# Patient Record
Sex: Female | Born: 1945 | Race: Black or African American | Hispanic: No | Smoking: Never smoker
Health system: Southern US, Community
[De-identification: ages and names within clinical notes are randomized; demographics above are authoritative.]

## PROBLEM LIST (undated history)

## (undated) DIAGNOSIS — G4700 Insomnia, unspecified: Secondary | ICD-10-CM

## (undated) DIAGNOSIS — I10 Essential (primary) hypertension: Secondary | ICD-10-CM

## (undated) DIAGNOSIS — Z973 Presence of spectacles and contact lenses: Secondary | ICD-10-CM

## (undated) HISTORY — PX: COLONOSCOPY: SHX174

## (undated) HISTORY — PX: CYST REMOVAL HAND: SHX6279

## (undated) HISTORY — PX: ABDOMINAL HYSTERECTOMY: SHX81

## (undated) HISTORY — PX: TUBAL LIGATION: SHX77

## (undated) HISTORY — PX: APPENDECTOMY: SHX54

---

## 1999-08-02 ENCOUNTER — Other Ambulatory Visit: Admission: RE | Admit: 1999-08-02 | Discharge: 1999-08-02 | Payer: Self-pay | Admitting: Orthopedic Surgery

## 2000-11-14 ENCOUNTER — Ambulatory Visit (HOSPITAL_COMMUNITY): Admission: RE | Admit: 2000-11-14 | Discharge: 2000-11-14 | Payer: Self-pay | Admitting: Gastroenterology

## 2003-05-03 HISTORY — PX: ANKLE GANGLION CYST EXCISION: SHX1148

## 2014-03-26 ENCOUNTER — Other Ambulatory Visit: Payer: Self-pay | Admitting: Orthopedic Surgery

## 2014-05-09 ENCOUNTER — Encounter (HOSPITAL_BASED_OUTPATIENT_CLINIC_OR_DEPARTMENT_OTHER)
Admission: RE | Admit: 2014-05-09 | Discharge: 2014-05-09 | Disposition: A | Discharge status: Home / Self Care | Payer: Medicare Other | Source: Ambulatory Visit | Attending: Orthopedic Surgery | Admitting: Orthopedic Surgery

## 2014-05-09 ENCOUNTER — Encounter (HOSPITAL_BASED_OUTPATIENT_CLINIC_OR_DEPARTMENT_OTHER): Payer: Self-pay | Admitting: *Deleted

## 2014-05-09 ENCOUNTER — Other Ambulatory Visit: Payer: Self-pay

## 2014-05-09 DIAGNOSIS — Z01818 Encounter for other preprocedural examination: Secondary | ICD-10-CM | POA: Insufficient documentation

## 2014-05-09 DIAGNOSIS — R2231 Localized swelling, mass and lump, right upper limb: Secondary | ICD-10-CM | POA: Diagnosis present

## 2014-05-09 DIAGNOSIS — M67441 Ganglion, right hand: Secondary | ICD-10-CM | POA: Diagnosis not present

## 2014-05-09 DIAGNOSIS — I10 Essential (primary) hypertension: Secondary | ICD-10-CM | POA: Diagnosis not present

## 2014-05-09 DIAGNOSIS — G4700 Insomnia, unspecified: Secondary | ICD-10-CM | POA: Diagnosis not present

## 2014-05-09 LAB — BASIC METABOLIC PANEL
Anion gap: 12 (ref 5–15)
BUN: 8 mg/dL (ref 6–23)
CO2: 27 mmol/L (ref 19–32)
Calcium: 9.2 mg/dL (ref 8.4–10.5)
Chloride: 99 mEq/L (ref 96–112)
Creatinine, Ser: 0.88 mg/dL (ref 0.50–1.10)
GFR calc Af Amer: 78 mL/min — ABNORMAL LOW (ref >90)
GFR, EST NON AFRICAN AMERICAN: 67 mL/min — AB (ref >90)
Glucose, Bld: 80 mg/dL (ref 70–99)
Potassium: 3.8 mmol/L (ref 3.5–5.1)
Sodium: 138 mmol/L (ref 135–145)

## 2014-05-09 NOTE — Progress Notes (Signed)
Pt has labs 2 months ago-will need bmet-ekg

## 2014-05-15 ENCOUNTER — Ambulatory Visit (HOSPITAL_BASED_OUTPATIENT_CLINIC_OR_DEPARTMENT_OTHER): Payer: Medicare Other | Admitting: Certified Registered"

## 2014-05-15 ENCOUNTER — Encounter (HOSPITAL_BASED_OUTPATIENT_CLINIC_OR_DEPARTMENT_OTHER)
Admission: RE | Disposition: A | Discharge status: Home / Self Care | Payer: Self-pay | Source: Ambulatory Visit | Attending: Orthopedic Surgery

## 2014-05-15 ENCOUNTER — Encounter (HOSPITAL_BASED_OUTPATIENT_CLINIC_OR_DEPARTMENT_OTHER): Payer: Self-pay | Admitting: Certified Registered"

## 2014-05-15 ENCOUNTER — Ambulatory Visit (HOSPITAL_BASED_OUTPATIENT_CLINIC_OR_DEPARTMENT_OTHER)
Admission: RE | Admit: 2014-05-15 | Discharge: 2014-05-15 | Disposition: A | Discharge status: Home / Self Care | Payer: Medicare Other | Source: Ambulatory Visit | Attending: Orthopedic Surgery | Admitting: Orthopedic Surgery

## 2014-05-15 DIAGNOSIS — M67441 Ganglion, right hand: Secondary | ICD-10-CM | POA: Insufficient documentation

## 2014-05-15 DIAGNOSIS — G4700 Insomnia, unspecified: Secondary | ICD-10-CM | POA: Diagnosis not present

## 2014-05-15 DIAGNOSIS — I10 Essential (primary) hypertension: Secondary | ICD-10-CM | POA: Insufficient documentation

## 2014-05-15 HISTORY — PX: MASS EXCISION: SHX2000

## 2014-05-15 HISTORY — DX: Presence of spectacles and contact lenses: Z97.3

## 2014-05-15 HISTORY — DX: Essential (primary) hypertension: I10

## 2014-05-15 HISTORY — DX: Insomnia, unspecified: G47.00

## 2014-05-15 LAB — POCT HEMOGLOBIN-HEMACUE: Hemoglobin: 12.8 g/dL (ref 12.0–15.0)

## 2014-05-15 SURGERY — EXCISION MASS
Anesthesia: Monitor Anesthesia Care | Site: Finger | Laterality: Right

## 2014-05-15 MED ORDER — ONDANSETRON HCL 4 MG/2ML IJ SOLN
INTRAMUSCULAR | Status: DC | PRN
  Administered 2014-05-15: 4 mg via INTRAVENOUS

## 2014-05-15 MED ORDER — HYDROCODONE-ACETAMINOPHEN 5-325 MG PO TABS: ORAL_TABLET | ORAL | Status: AC

## 2014-05-15 MED ORDER — FENTANYL CITRATE 0.05 MG/ML IJ SOLN: 25.0000 ug | INTRAMUSCULAR | Status: DC | PRN

## 2014-05-15 MED ORDER — FENTANYL CITRATE 0.05 MG/ML IJ SOLN
INTRAMUSCULAR | Status: AC
  Filled 2014-05-15: qty 6

## 2014-05-15 MED ORDER — MIDAZOLAM HCL 2 MG/2ML IJ SOLN
INTRAMUSCULAR | Status: AC
  Filled 2014-05-15: qty 2

## 2014-05-15 MED ORDER — CHLORHEXIDINE GLUCONATE 4 % EX LIQD: 60.0000 mL | Freq: Once | CUTANEOUS | Status: DC

## 2014-05-15 MED ORDER — PROPOFOL 10 MG/ML IV EMUL
INTRAVENOUS | Status: AC
  Filled 2014-05-15: qty 50

## 2014-05-15 MED ORDER — MIDAZOLAM HCL 5 MG/5ML IJ SOLN
INTRAMUSCULAR | Status: DC | PRN
Start: 2014-05-15 — End: 2014-05-15
  Administered 2014-05-15: 2 mg via INTRAVENOUS

## 2014-05-15 MED ORDER — FENTANYL CITRATE 0.05 MG/ML IJ SOLN: 50.0000 ug | INTRAMUSCULAR | Status: DC | PRN

## 2014-05-15 MED ORDER — ONDANSETRON HCL 4 MG/2ML IJ SOLN: 4.0000 mg | Freq: Once | INTRAMUSCULAR | Status: DC | PRN

## 2014-05-15 MED ORDER — LACTATED RINGERS IV SOLN
INTRAVENOUS | Status: DC
  Administered 2014-05-15: 09:00:00 via INTRAVENOUS

## 2014-05-15 MED ORDER — 0.9 % SODIUM CHLORIDE (POUR BTL) OPTIME
TOPICAL | Status: DC | PRN
  Administered 2014-05-15: 100 mL

## 2014-05-15 MED ORDER — MIDAZOLAM HCL 2 MG/2ML IJ SOLN: 1.0000 mg | INTRAMUSCULAR | Status: DC | PRN

## 2014-05-15 MED ORDER — CEFAZOLIN SODIUM-DEXTROSE 2-3 GM-% IV SOLR
2.0000 g | INTRAVENOUS | Status: AC
  Administered 2014-05-15: 2 g via INTRAVENOUS

## 2014-05-15 MED ORDER — CEFAZOLIN SODIUM-DEXTROSE 2-3 GM-% IV SOLR
INTRAVENOUS | Status: AC
  Filled 2014-05-15: qty 50

## 2014-05-15 MED ORDER — LIDOCAINE HCL (PF) 0.5 % IJ SOLN
INTRAMUSCULAR | Status: DC | PRN
  Administered 2014-05-15: 25 mL via INTRAVENOUS

## 2014-05-15 MED ORDER — SUCCINYLCHOLINE CHLORIDE 20 MG/ML IJ SOLN
INTRAMUSCULAR | Status: AC
  Filled 2014-05-15: qty 1

## 2014-05-15 MED ORDER — BUPIVACAINE HCL (PF) 0.25 % IJ SOLN
INTRAMUSCULAR | Status: DC | PRN
  Administered 2014-05-15: 8 mL

## 2014-05-15 MED ORDER — PROPOFOL 10 MG/ML IV BOLUS
INTRAVENOUS | Status: DC | PRN
  Administered 2014-05-15: 20 mg via INTRAVENOUS
  Administered 2014-05-15: 10 mg via INTRAVENOUS

## 2014-05-15 MED ORDER — FENTANYL CITRATE 0.05 MG/ML IJ SOLN
INTRAMUSCULAR | Status: DC | PRN
  Administered 2014-05-15 (×2): 50 ug via INTRAVENOUS

## 2014-05-15 SURGICAL SUPPLY — 50 items
BANDAGE COBAN STERILE 2 (GAUZE/BANDAGES/DRESSINGS) IMPLANT
BANDAGE ELASTIC 3 VELCRO ST LF (GAUZE/BANDAGES/DRESSINGS) IMPLANT
BENZOIN TINCTURE PRP APPL 2/3 (GAUZE/BANDAGES/DRESSINGS) IMPLANT
BLADE MINI RND TIP GREEN BEAV (BLADE) IMPLANT
BLADE SURG 15 STRL LF DISP TIS (BLADE) ×2 IMPLANT
BLADE SURG 15 STRL SS (BLADE) ×4
BNDG COHESIVE 1X5 TAN STRL LF (GAUZE/BANDAGES/DRESSINGS) IMPLANT
BNDG CONFORM 2 STRL LF (GAUZE/BANDAGES/DRESSINGS) IMPLANT
BNDG ELASTIC 2 VLCR STRL LF (GAUZE/BANDAGES/DRESSINGS) IMPLANT
BNDG ESMARK 4X9 LF (GAUZE/BANDAGES/DRESSINGS) ×3 IMPLANT
BNDG GAUZE 1X2.1 STRL (MISCELLANEOUS) ×3 IMPLANT
BNDG GAUZE ELAST 4 BULKY (GAUZE/BANDAGES/DRESSINGS) ×3 IMPLANT
BNDG PLASTER X FAST 3X3 WHT LF (CAST SUPPLIES) IMPLANT
CHLORAPREP W/TINT 26ML (MISCELLANEOUS) ×3 IMPLANT
CLOSURE WOUND 1/2 X4 (GAUZE/BANDAGES/DRESSINGS)
CORDS BIPOLAR (ELECTRODE) ×3 IMPLANT
COVER BACK TABLE 60X90IN (DRAPES) ×3 IMPLANT
COVER MAYO STAND STRL (DRAPES) ×3 IMPLANT
CUFF TOURNIQUET SINGLE 18IN (TOURNIQUET CUFF) ×3 IMPLANT
DRAPE EXTREMITY T 121X128X90 (DRAPE) ×3 IMPLANT
DRAPE SURG 17X23 STRL (DRAPES) ×3 IMPLANT
GAUZE SPONGE 4X4 12PLY STRL (GAUZE/BANDAGES/DRESSINGS) ×3 IMPLANT
GAUZE XEROFORM 1X8 LF (GAUZE/BANDAGES/DRESSINGS) ×3 IMPLANT
GLOVE BIO SURGEON STRL SZ7.5 (GLOVE) ×3 IMPLANT
GLOVE BIOGEL PI IND STRL 8 (GLOVE) ×1 IMPLANT
GLOVE BIOGEL PI INDICATOR 8 (GLOVE) ×2
GOWN STRL REUS W/ TWL LRG LVL3 (GOWN DISPOSABLE) ×1 IMPLANT
GOWN STRL REUS W/TWL LRG LVL3 (GOWN DISPOSABLE) ×2
GOWN STRL REUS W/TWL XL LVL3 (GOWN DISPOSABLE) ×3 IMPLANT
NEEDLE HYPO 25X1 1.5 SAFETY (NEEDLE) ×3 IMPLANT
NS IRRIG 1000ML POUR BTL (IV SOLUTION) ×3 IMPLANT
PACK BASIN DAY SURGERY FS (CUSTOM PROCEDURE TRAY) ×3 IMPLANT
PAD CAST 3X4 CTTN HI CHSV (CAST SUPPLIES) IMPLANT
PAD CAST 4YDX4 CTTN HI CHSV (CAST SUPPLIES) IMPLANT
PADDING CAST ABS 4INX4YD NS (CAST SUPPLIES) ×2
PADDING CAST ABS COTTON 4X4 ST (CAST SUPPLIES) ×1 IMPLANT
PADDING CAST COTTON 3X4 STRL (CAST SUPPLIES)
PADDING CAST COTTON 4X4 STRL (CAST SUPPLIES)
STOCKINETTE 4X48 STRL (DRAPES) ×3 IMPLANT
STRIP CLOSURE SKIN 1/2X4 (GAUZE/BANDAGES/DRESSINGS) IMPLANT
SUT ETHILON 3 0 PS 1 (SUTURE) IMPLANT
SUT ETHILON 4 0 PS 2 18 (SUTURE) IMPLANT
SUT ETHILON 5 0 P 3 18 (SUTURE) ×2
SUT MERSILENE 4 0 P 3 (SUTURE) ×3 IMPLANT
SUT NYLON ETHILON 5-0 P-3 1X18 (SUTURE) ×1 IMPLANT
SUT VIC AB 4-0 P2 18 (SUTURE) IMPLANT
SYR BULB 3OZ (MISCELLANEOUS) ×3 IMPLANT
SYR CONTROL 10ML LL (SYRINGE) ×3 IMPLANT
TOWEL OR 17X24 6PK STRL BLUE (TOWEL DISPOSABLE) ×3 IMPLANT
UNDERPAD 30X30 INCONTINENT (UNDERPADS AND DIAPERS) ×3 IMPLANT

## 2014-05-15 NOTE — H&P (Signed)
  Brenda Aguilar is an 69 y.o. female.   Chief Complaint: right small finger mass HPI: 69 yo rhd female with mass on right small finger x 2 years.  It is bothersome to her especially with palpation.  She wishes to have it removed.  Past Medical History  Diagnosis Date  . Hypertension   . Insomnia   . Wears contact lenses     Past Surgical History  Procedure Laterality Date  . Abdominal hysterectomy    . Appendectomy    . Tubal ligation    . Ankle ganglion cyst excision  2005    right  . Colonoscopy      History reviewed. No pertinent family history. Social History:  reports that she has never smoked. She does not have any smokeless tobacco history on file. She reports that she does not drink alcohol or use illicit drugs.  Allergies: No Known Allergies  Medications Prior to Admission  Medication Sig Dispense Refill  . acetaminophen (TYLENOL) 325 MG tablet Take 650 mg by mouth every 6 (six) hours as needed.    Marland Kitchen. amLODipine (NORVASC) 5 MG tablet Take 5 mg by mouth daily.    . Calcium-Phosphorus-Vitamin D (CITRACAL +D3 PO) Take by mouth every morning.    . cholecalciferol (VITAMIN D) 1000 UNITS tablet Take 1,000 Units by mouth daily. Takes 2000    . eszopiclone (LUNESTA) 1 MG TABS tablet Take 1 mg by mouth at bedtime as needed for sleep. Take immediately before bedtime      No results found for this or any previous visit (from the past 48 hour(s)).  No results found.   A comprehensive review of systems was negative except for: Eyes: positive for contacts/glasses  Height 5\' 3"  (1.6 m), weight 55.339 kg (122 lb).  General appearance: alert, cooperative and appears stated age Head: Normocephalic, without obvious abnormality, atraumatic Neck: supple, symmetrical, trachea midline Resp: clear to auscultation bilaterally Cardio: regular rate and rhythm GI: non tender Extremities: intact sensation and capillary refill all digits.  +epl/fpl/io.  no wounds.  right small finger  mass. Pulses: 2+ and symmetric Skin: Skin color, texture, turgor normal. No rashes or lesions Neurologic: Grossly normal Incision/Wound: none  Assessment/Plan Right small finger mass.  Non operative and operative treatment options were discussed with the patient and patient wishes to proceed with operative treatment. Risks, benefits, and alternatives of surgery were discussed and the patient agrees with the plan of care.   Elridge Stemm R 05/15/2014, 8:31 AM

## 2014-05-15 NOTE — Op Note (Signed)
Brenda Aguilar, Brenda Aguilar                  ACCOUNT NO.:  [id number]  MEDICAL RECORD NO.:  [id number]  LOCATION:                                 FACILITY:  PHYSICIAN:  Betha Loa, MD             DATE OF BIRTH:  DATE OF PROCEDURE:  05/15/2014 DATE OF DISCHARGE:                              OPERATIVE REPORT   PREOPERATIVE DIAGNOSIS:  Right small finger mass.  POSTOPERATIVE DIAGNOSIS:  Right small finger mass.  PROCEDURE:  Excision mass right small finger.  SURGEON:  Betha Loa, MD  ASSISTANT:  None.  ANESTHESIA:  Bier block with sedation.  IV FLUIDS:  Per anesthesia flow sheet.  ESTIMATED BLOOD LOSS:  Minimal.  COMPLICATIONS:  None.  SPECIMENS:  Right small finger mass to Pathology.  TOURNIQUET TIME:  23 minutes.  DISPOSITION:  Stable to PACU.  INDICATIONS:  Brenda Aguilar is a 68 year old female with a mass on her right small finger.  It is bothersome to her.  She states it is bothersome especially when she bumps it.  She wished to have it removed.  Risks, benefits, and alternatives of surgery were discussed including risk of blood loss, infection, damage to nerves, vessels, tendons, ligaments, bone, failure of surgery, need for additional surgery, complications with wound healing, continued pain, recurrence of mass.  She voiced understanding of these risks and elected to proceed.  OPERATIVE COURSE:  After being identified preoperatively by myself, the patient and I agreed upon procedure and site of procedure.  Surgical site was marked.  The risks, benefits, and alternatives of surgery were reviewed and she wished to proceed.  Surgical consent had been signed. She was given IV Ancef as preoperative antibiotic prophylaxis.  She was transferred to the operating room and placed on the operating room table in supine position with the right upper extremity on armboard.  A Bier block anesthesia was induced by anesthesiologist.  The right upper extremity was prepped and draped in  normal sterile orthopedic fashion. A surgical pause was performed between surgeons, anesthesia, and operating room staff, and all were in agreement as to the patient, procedure, and site of procedure.  Tourniquet at the proximal aspect of the forearm had been inflated for the Bier block.  Incision was made on the dorsum of the finger over the mass.  This was carried into subcutaneous tissues by spreading technique.  The mass was slightly adherent to the skin.  It was adherent to the tendon underneath.  It was freed up from all soft tissue attachments.  The tendon was intact. There was a small area of thinned tendon on the ulnar side proximal to the PIP joint.  The wound was copiously irrigated with sterile saline. A single stitch was placed in the thinned portion of the tendon with 4-0 Mersilene suture to reapproximate the thicker edges.  The skin was closed with 4-0 nylon in a horizontal mattress fashion.  The wound was then dressed with sterile Xeroform, 4 x 4 and wrapped with a Coban dressing lightly.  Tourniquet was deflated at 23 minutes.  Fingertips were pink with brisk capillary refill after deflation of the tourniquet. Operative  drapes were broken down, and the patient was awoken from anesthesia safely.  She was transferred back to stretcher and taken to PACU in stable condition.  I will see her back in the office in 1 week for postoperative followup.  I will give her Norco 5/325, 1-2 p.o. q.6 hours p.r.n. pain, dispensed #30.  A digital block was performed with 8 mL of 0.25% plain Marcaine to aid in postoperative analgesia.     Betha LoaKevin Nohelani Benning, MD     KK/MEDQ  D:  05/15/2014  T:  05/15/2014  Job:  [id number]

## 2014-05-15 NOTE — Anesthesia Postprocedure Evaluation (Signed)
  Anesthesia Post-op Note  Patient: Dirk DressMary L Mickley  Procedure(s) Performed: Procedure(s): EXCISION MASS RIGHT SMALL FINGER  (Right)  Patient Location: PACU  Anesthesia Type: Bier Block, MAC   Level of Consciousness: awake, alert  and oriented  Airway and Oxygen Therapy: Patient Spontanous Breathing  Post-op Pain: mild  Post-op Assessment: Post-op Vital signs reviewed  Post-op Vital Signs: Reviewed  Last Vitals:  Filed Vitals:   05/15/14 1142  BP: 133/81  Pulse: 67  Temp: 36.6 C  Resp: 18    Complications: No apparent anesthesia complications

## 2014-05-15 NOTE — Discharge Instructions (Addendum)

## 2014-05-15 NOTE — Anesthesia Preprocedure Evaluation (Signed)
Anesthesia Evaluation  Patient identified by MRN, date of birth, ID band Patient awake    Reviewed: Allergy & Precautions, NPO status , Patient's Chart, lab work & pertinent test results  Airway Mallampati: I  TM Distance: >3 FB Neck ROM: Full    Dental  (+) Teeth Intact, Dental Advisory Given   Pulmonary  breath sounds clear to auscultation        Cardiovascular hypertension, Pt. on medications Rhythm:Regular Rate:Normal     Neuro/Psych    GI/Hepatic   Endo/Other    Renal/GU      Musculoskeletal   Abdominal   Peds  Hematology   Anesthesia Other Findings   Reproductive/Obstetrics                             Anesthesia Physical Anesthesia Plan  ASA: II  Anesthesia Plan: Bier Block and MAC   Post-op Pain Management:    Induction: Intravenous  Airway Management Planned: Simple Face Mask  Additional Equipment:   Intra-op Plan:   Post-operative Plan:   Informed Consent: I have reviewed the patients History and Physical, chart, labs and discussed the procedure including the risks, benefits and alternatives for the proposed anesthesia with the patient or authorized representative who has indicated his/her understanding and acceptance.     Plan Discussed with: CRNA, Anesthesiologist and Surgeon  Anesthesia Plan Comments:         Anesthesia Quick Evaluation

## 2014-05-15 NOTE — Op Note (Signed)
[id number] 

## 2014-05-15 NOTE — Anesthesia Procedure Notes (Signed)
Procedure Name: MAC Date/Time: 05/15/2014 10:02 AM Performed by: Curly ShoresRAFT, Yaileen Hofferber W Pre-anesthesia Checklist: Patient identified, Emergency Drugs available, Suction available and Patient being monitored Patient Re-evaluated:Patient Re-evaluated prior to inductionOxygen Delivery Method: Simple face mask Preoxygenation: Pre-oxygenation with 100% oxygen Intubation Type: IV induction Dental Injury: Teeth and Oropharynx as per pre-operative assessment

## 2014-05-15 NOTE — Brief Op Note (Signed)
05/15/2014  10:37 AM  PATIENT:  Brenda Aguilar  69 y.o. female  PRE-OPERATIVE DIAGNOSIS:  RIGHT SMALL FINGER MASS   POST-OPERATIVE DIAGNOSIS:  RIGHT SMALL FINGER MASS   PROCEDURE:  Procedure(s): EXCISION MASS RIGHT SMALL FINGER  (Right)  SURGEON:  Surgeon(s) and Role:    * Betha LoaKevin Davis Vannatter, MD - Primary  PHYSICIAN ASSISTANT:   ASSISTANTS: none   ANESTHESIA:   Bier block with sedation  EBL:  Total I/O In: 700 [I.V.:700] Out: -   BLOOD ADMINISTERED:none  DRAINS: none   LOCAL MEDICATIONS USED:  MARCAINE     SPECIMEN:  Source of Specimen:  right small finger  DISPOSITION OF SPECIMEN:  PATHOLOGY  COUNTS:  YES  TOURNIQUET:   Total Tourniquet Time Documented: Forearm (Right) - 23 minutes Total: Forearm (Right) - 23 minutes   DICTATION: .Other Dictation: Dictation Number [id number]  PLAN OF CARE: Discharge to home after PACU  PATIENT DISPOSITION:  PACU - hemodynamically stable.

## 2014-05-15 NOTE — Transfer of Care (Signed)
Immediate Anesthesia Transfer of Care Note  Patient: Brenda Aguilar  Procedure(s) Performed: Procedure(s): EXCISION MASS RIGHT SMALL FINGER  (Right)  Patient Location: PACU  Anesthesia Type:MAC and Bier block  Level of Consciousness: awake, alert  and oriented  Airway & Oxygen Therapy: Patient Spontanous Breathing and Patient connected to face mask oxygen  Post-op Assessment: Report given to PACU RN, Post -op Vital signs reviewed and stable and Patient moving all extremities  Post vital signs: Reviewed and stable  Complications: No apparent anesthesia complications

## 2014-05-16 ENCOUNTER — Encounter (HOSPITAL_BASED_OUTPATIENT_CLINIC_OR_DEPARTMENT_OTHER): Payer: Self-pay | Admitting: Orthopedic Surgery

## 2015-11-03 ENCOUNTER — Encounter (HOSPITAL_COMMUNITY): Payer: Self-pay | Admitting: Emergency Medicine

## 2015-11-03 ENCOUNTER — Emergency Department (HOSPITAL_COMMUNITY)
Admission: EM | Admit: 2015-11-03 | Discharge: 2015-11-03 | Disposition: A | Discharge status: Home / Self Care | Payer: Medicare Other | Attending: Emergency Medicine | Admitting: Emergency Medicine

## 2015-11-03 DIAGNOSIS — Y939 Activity, unspecified: Secondary | ICD-10-CM | POA: Diagnosis not present

## 2015-11-03 DIAGNOSIS — I10 Essential (primary) hypertension: Secondary | ICD-10-CM | POA: Diagnosis not present

## 2015-11-03 DIAGNOSIS — Y999 Unspecified external cause status: Secondary | ICD-10-CM | POA: Insufficient documentation

## 2015-11-03 DIAGNOSIS — Z79899 Other long term (current) drug therapy: Secondary | ICD-10-CM | POA: Insufficient documentation

## 2015-11-03 DIAGNOSIS — N179 Acute kidney failure, unspecified: Secondary | ICD-10-CM | POA: Insufficient documentation

## 2015-11-03 DIAGNOSIS — Y929 Unspecified place or not applicable: Secondary | ICD-10-CM | POA: Diagnosis not present

## 2015-11-03 DIAGNOSIS — R55 Syncope and collapse: Secondary | ICD-10-CM

## 2015-11-03 DIAGNOSIS — W1809XA Striking against other object with subsequent fall, initial encounter: Secondary | ICD-10-CM | POA: Diagnosis not present

## 2015-11-03 DIAGNOSIS — T675XXA Heat exhaustion, unspecified, initial encounter: Secondary | ICD-10-CM | POA: Insufficient documentation

## 2015-11-03 LAB — CBC WITH DIFFERENTIAL/PLATELET
BASOS PCT: 0 %
Basophils Absolute: 0 10*3/uL (ref 0.0–0.1)
EOS ABS: 0 10*3/uL (ref 0.0–0.7)
Eosinophils Relative: 0 %
HEMATOCRIT: 33.2 % — AB (ref 36.0–46.0)
Hemoglobin: 11.6 g/dL — ABNORMAL LOW (ref 12.0–15.0)
Lymphocytes Relative: 20 %
Lymphs Abs: 1 10*3/uL (ref 0.7–4.0)
MCH: 32.2 pg (ref 26.0–34.0)
MCHC: 34.9 g/dL (ref 30.0–36.0)
MCV: 92.2 fL (ref 78.0–100.0)
Monocytes Absolute: 0.4 10*3/uL (ref 0.1–1.0)
Monocytes Relative: 8 %
NEUTROS ABS: 3.6 10*3/uL (ref 1.7–7.7)
Neutrophils Relative %: 72 %
Platelets: 218 10*3/uL (ref 150–400)
RBC: 3.6 MIL/uL — AB (ref 3.87–5.11)
RDW: 12.4 % (ref 11.5–15.5)
WBC: 5.1 10*3/uL (ref 4.0–10.5)

## 2015-11-03 LAB — BASIC METABOLIC PANEL
Anion gap: 5 (ref 5–15)
BUN: 14 mg/dL (ref 6–20)
CHLORIDE: 105 mmol/L (ref 101–111)
CO2: 27 mmol/L (ref 22–32)
Calcium: 8.3 mg/dL — ABNORMAL LOW (ref 8.9–10.3)
Creatinine, Ser: 1.3 mg/dL — ABNORMAL HIGH (ref 0.44–1.00)
GFR calc non Af Amer: 41 mL/min — ABNORMAL LOW (ref >60)
GFR, EST AFRICAN AMERICAN: 48 mL/min — AB (ref >60)
Glucose, Bld: 119 mg/dL — ABNORMAL HIGH (ref 65–99)
POTASSIUM: 3.4 mmol/L — AB (ref 3.5–5.1)
SODIUM: 137 mmol/L (ref 135–145)

## 2015-11-03 MED ORDER — SODIUM CHLORIDE 0.9 % IV BOLUS (SEPSIS)
1000.0000 mL | Freq: Once | INTRAVENOUS | Status: AC
  Administered 2015-11-03: 1000 mL via INTRAVENOUS

## 2015-11-03 NOTE — ED Notes (Signed)
Bed: WA03 Expected date:  Expected time:  Means of arrival:  Comments: EMS- heat exhaustion

## 2015-11-03 NOTE — ED Provider Notes (Signed)
CSN: [id number]     Arrival date & time 11/03/15  1656 History  By signing my name below, I, Freida Busmaniana Omoyeni, attest that this documentation has been prepared under the direction and in the presence of Blane OharaJoshua Jihan Rudy, MD . Electronically Signed: Freida Busmaniana Omoyeni, Scribe. 11/03/2015. 5:20 PM.    Chief Complaint  Patient presents with  . Heat Exposure  . Loss of Consciousness   The history is provided by the patient. No language interpreter was used.     HPI Comments:  Brenda Aguilar is a 70 y.o. female brought in by ambulance,  who presents to the Emergency Department s/p witnessed syncopal episode this afternoon ~1300. Pt states she struck her head on concrete when she fell; denies neck pain and HA at this time. Pt states she was out in the heat for ~ 3 hours without adequate hydration. She states she felt lightheaded prior to syncope. Pt has no other complaints or symptoms at this time.  She denies blood in stool and black tarry stools, SOB, CP, and vision/speech changes. No modifying factors noted. Pt arrived in C-collar.   Past Medical History  Diagnosis Date  . Hypertension   . Insomnia   . Wears contact lenses    Past Surgical History  Procedure Laterality Date  . Abdominal hysterectomy    . Appendectomy    . Tubal ligation    . Ankle ganglion cyst excision  2005    right  . Colonoscopy    . Mass excision Right 05/15/2014    Procedure: EXCISION MASS RIGHT SMALL FINGER ;  Surgeon: Betha LoaKevin Kuzma, MD;  Location: [facility];  Service: Orthopedics;  Laterality: Right;  . Cyst removal hand     No family history on file. Social History  Substance Use Topics  . Smoking status: Never Smoker   . Smokeless tobacco: None  . Alcohol Use: No   OB History    No data available     Review of Systems  Respiratory: Negative for shortness of breath.   Cardiovascular: Negative for chest pain.  Gastrointestinal: Negative for vomiting and blood in stool.  Musculoskeletal: Negative for  back pain and neck pain.  Neurological: Positive for syncope. Negative for headaches.  All other systems reviewed and are negative.   Allergies  Review of patient's allergies indicates no known allergies.  Home Medications   Prior to Admission medications   Medication Sig Start Date End Date Taking? Authorizing Provider  amLODipine (NORVASC) 5 MG tablet Take 5 mg by mouth daily.   Yes Historical Provider, MD  Calcium-Phosphorus-Vitamin D (CITRACAL +D3 PO) Take 1 tablet by mouth every morning.    Yes Historical Provider, MD  cholecalciferol (VITAMIN D) 1000 UNITS tablet Take 1,000 Units by mouth daily. Takes 2000   Yes Historical Provider, MD  Eszopiclone 3 MG TABS Take 3 mg by mouth at bedtime as needed (sleep).  09/04/15  Yes Historical Provider, MD  HYDROcodone-acetaminophen (NORCO) 5-325 MG per tablet 1-2 tabs po q6 hours prn pain Patient not taking: Reported on 11/03/2015 05/15/14   Betha LoaKevin Kuzma, MD   BP 119/79 mmHg  Pulse 85  Temp(Src) 98.8 F (37.1 C) (Oral)  Resp 14  Ht 5\' 3"  (1.6 m)  Wt 122 lb (55.339 kg)  BMI 21.62 kg/m2  SpO2 100% Physical Exam  Constitutional: She is oriented to person, place, and time. She appears well-developed and well-nourished. No distress.  HENT:  Head: Normocephalic and atraumatic.  Mouth/Throat: Mucous membranes are dry.  Eyes: Conjunctivae and EOM are normal. Pupils are equal, round, and reactive to light.  Cardiovascular: Normal rate, regular rhythm and normal heart sounds.   Pulmonary/Chest: Effort normal and breath sounds normal. No respiratory distress.  Anterior lung fields clear  Abdominal: Soft. She exhibits no distension. There is no tenderness.  Musculoskeletal:  Normal hip ROM No C/L/T tenderness  Neurological: She is alert and oriented to person, place, and time.  5+ strength in all extremities No facial droop No arm drift Finger to nose intact bilaterally  Skin: Skin is warm and dry.  Psychiatric: She has a normal mood and  affect.  Nursing note and vitals reviewed.   ED Course  Procedures   DIAGNOSTIC STUDIES:  Oxygen Saturation is 100% on RA, normal by my interpretation.    COORDINATION OF CARE:  5:15 PM Discussed treatment plan with pt at bedside and pt agreed to plan.  Labs Review Labs Reviewed  CBC WITH DIFFERENTIAL/PLATELET - Abnormal; Notable for the following:    RBC 3.60 (*)    Hemoglobin 11.6 (*)    HCT 33.2 (*)    All other components within normal limits  BASIC METABOLIC PANEL - Abnormal; Notable for the following:    Potassium 3.4 (*)    Glucose, Bld 119 (*)    Creatinine, Ser 1.30 (*)    Calcium 8.3 (*)    GFR calc non Af Amer 41 (*)    GFR calc Af Amer 48 (*)    All other components within normal limits    Imaging Review No results found. I have personally reviewed and evaluated these images and lab results as part of my medical decision-making.   EKG Interpretation None      MDM   Final diagnoses:  Heat exhaustion, initial encounter  Syncope and collapse  Acute renal failure, unspecified acute renal failure type [facility](HCC)   Patient presents with gradual onset syncope after being outside in the heat for 3 hours minimal fluid intake. Blood pressure initially 80/50. Patient is on a beta blocker. Patient improved with IV fluids. Normal neurologic exam, no aortic stenosis murmur. Well-appearing the ER. No indication for emergent CT head, necks is negative. Plan for repeat IV fluid bolus and ambulation with oral fluid challenge.  Results and differential diagnosis were discussed with the patient/parent/guardian. Xrays were independently reviewed by myself.  Close follow up outpatient was discussed, comfortable with the plan.   Medications  sodium chloride 0.9 % bolus 1,000 mL (1,000 mLs Intravenous New Bag/Given 11/03/15 1736)  sodium chloride 0.9 % bolus 1,000 mL (0 mLs Intravenous Stopped 11/03/15 1817)    Filed Vitals:   11/03/15 1708 11/03/15 1714 11/03/15 1820  BP:   115/79 119/79  Pulse:  84 85  Temp:  98.8 F (37.1 C)   TempSrc:  Oral   Resp:  12 14  Height: 5\' 3"  (1.6 m)    Weight: 122 lb (55.339 kg)    SpO2:  100% 100%    Final diagnoses:  Heat exhaustion, initial encounter  Syncope and collapse  Acute renal failure, unspecified acute renal failure type (HCC)     Blane OharaJoshua Knowledge Escandon, MD 11/03/15 1930

## 2015-11-03 NOTE — ED Notes (Signed)
MD at bedside. 

## 2015-11-03 NOTE — ED Notes (Addendum)
Per EMS, [place] 1[place]. Patient began to feel weak/lightheaded. Patient had syncopal episode witnessed by bystanders. Bystanders reported that patient hit head on concrete. Denies pain. Reports nausea. Upon EMS arrival, patient blood pressure 80/50 and pulse was 65 (patient on beta blocker). CBG 115. EMS put cold wet towel to torso. Patient given 500 mL of fluid. Patient last blood pressure was 113/74. Patient received zofran 4 mg IV en route with EMS. C-Collar in place. Alert and oriented.

## 2015-11-03 NOTE — Discharge Instructions (Signed)
Stay well hydrated, minimize time in the heat during the summer.  If you were given medicines take as directed.  If you are on coumadin or contraceptives realize their levels and effectiveness is altered by many different medicines.  If you have any reaction (rash, tongues swelling, other) to the medicines stop taking and see a physician.    If your blood pressure was elevated in the ER make sure you follow up for management with a primary doctor or return for chest pain, shortness of breath or stroke symptoms.  Please follow up as directed and return to the ER or see a physician for new or worsening symptoms.  Thank you. Filed Vitals:   11/03/15 1708 11/03/15 1714  BP:  115/79  Pulse:  84  Temp:  98.8 F (37.1 C)  TempSrc:  Oral  Resp:  12  Height: 5\' 3"  (1.6 m)   Weight: 122 lb (55.339 kg)   SpO2:  100%

## 2019-11-15 ENCOUNTER — Other Ambulatory Visit: Payer: Self-pay | Admitting: Internal Medicine

## 2019-11-15 DIAGNOSIS — R102 Pelvic and perineal pain: Secondary | ICD-10-CM

## 2019-11-15 DIAGNOSIS — R3121 Asymptomatic microscopic hematuria: Secondary | ICD-10-CM

## 2019-12-02 ENCOUNTER — Inpatient Hospital Stay: Admission: RE | Admit: 2019-12-02 | Payer: Medicare Other | Source: Ambulatory Visit

## 2019-12-09 ENCOUNTER — Other Ambulatory Visit: Payer: Medicare Other

## 2019-12-11 ENCOUNTER — Ambulatory Visit
Admission: RE | Admit: 2019-12-11 | Discharge: 2019-12-11 | Disposition: A | Discharge status: Home / Self Care | Payer: Medicare Other | Source: Ambulatory Visit | Attending: Internal Medicine | Admitting: Internal Medicine

## 2019-12-11 DIAGNOSIS — R102 Pelvic and perineal pain: Secondary | ICD-10-CM

## 2019-12-11 DIAGNOSIS — R3121 Asymptomatic microscopic hematuria: Secondary | ICD-10-CM

## 2019-12-23 ENCOUNTER — Other Ambulatory Visit: Payer: Self-pay | Admitting: Internal Medicine

## 2019-12-23 DIAGNOSIS — R319 Hematuria, unspecified: Secondary | ICD-10-CM

## 2020-01-13 ENCOUNTER — Ambulatory Visit
Admission: RE | Admit: 2020-01-13 | Discharge: 2020-01-13 | Disposition: A | Discharge status: Home / Self Care | Payer: Medicare Other | Source: Ambulatory Visit | Attending: Internal Medicine | Admitting: Internal Medicine

## 2020-01-13 ENCOUNTER — Other Ambulatory Visit: Payer: Self-pay

## 2020-01-13 DIAGNOSIS — R319 Hematuria, unspecified: Secondary | ICD-10-CM

## 2020-01-13 MED ORDER — IOPAMIDOL (ISOVUE-300) INJECTION 61%
100.0000 mL | Freq: Once | INTRAVENOUS | Status: AC | PRN
  Administered 2020-01-13: 100 mL via INTRAVENOUS

## 2020-02-15 ENCOUNTER — Ambulatory Visit: Payer: Medicare Other | Attending: Internal Medicine

## 2020-02-15 DIAGNOSIS — Z23 Encounter for immunization: Secondary | ICD-10-CM

## 2020-02-15 NOTE — Progress Notes (Signed)
   Covid-19 Vaccination Clinic  Name:  CHANTILLY LINSKEY    MRN: [id number] DOB: [birth year]  02/15/2020  Ms. Derflinger was observed post Covid-19 immunization for 15 minutes without incident. She was provided with Vaccine Information Sheet and instruction to access the V-Safe system.   Ms. Woodrow was instructed to call 911 with any severe reactions post vaccine: Marland Kitchen Difficulty breathing  . Swelling of face and throat  . A fast heartbeat  . A bad rash all over body  . Dizziness and weakness

## 2021-07-26 IMAGING — CT CT ABDOMEN WO/W CM
3 of 7 series · 12 of 32 positions shown, 17 images · IV contrast (APPLIED)
Comparison: 12/11/2019

CLINICAL DATA: Hematuria.

EXAM:
CT ABDOMEN WITHOUT AND WITH CONTRAST
TECHNIQUE: Multidetector CT imaging of the abdomen was performed following the
standard protocol before and following the bolus administration of
intravenous contrast.
CONTRAST:  100mL JQHEGC-0DD IOPAMIDOL (JQHEGC-0DD) INJECTION 61%

[Series 2: abd w/(date) · axial · 0.61mm/px · z∈[-233,-89]mm · 4 of 82 slices shown, 9 images]
[im 17/82  soft-tissue]
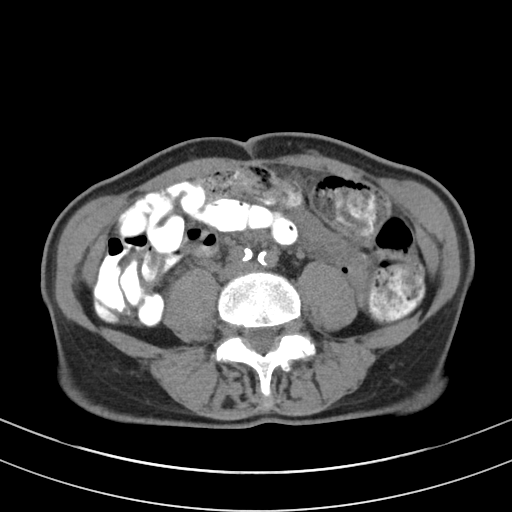
[im 17/82  lung]
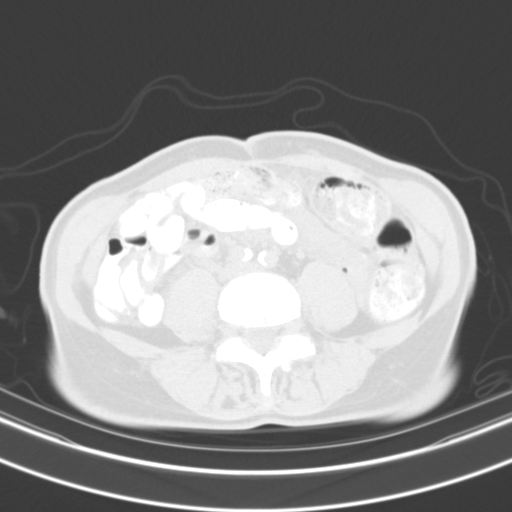
[im 17/82  bone]
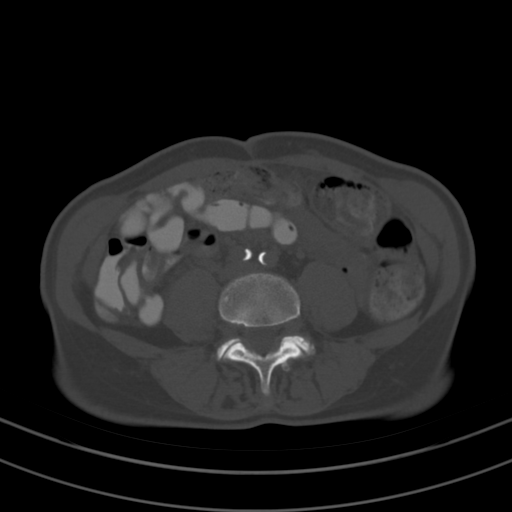
[im 33/82  soft-tissue]
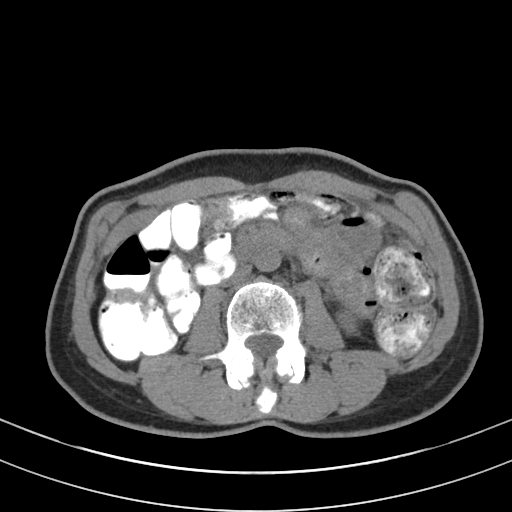
[im 33/82  lung]
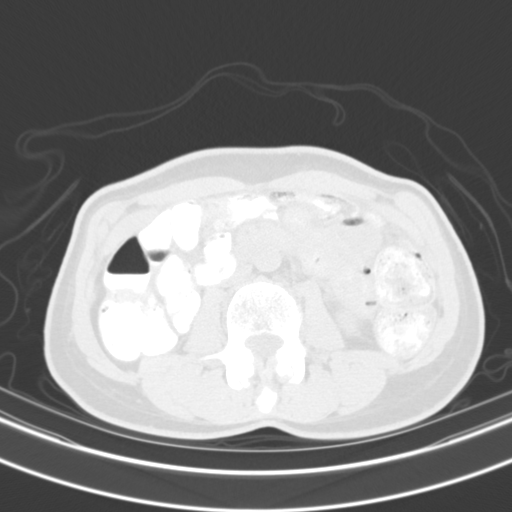
[im 49/82  soft-tissue]
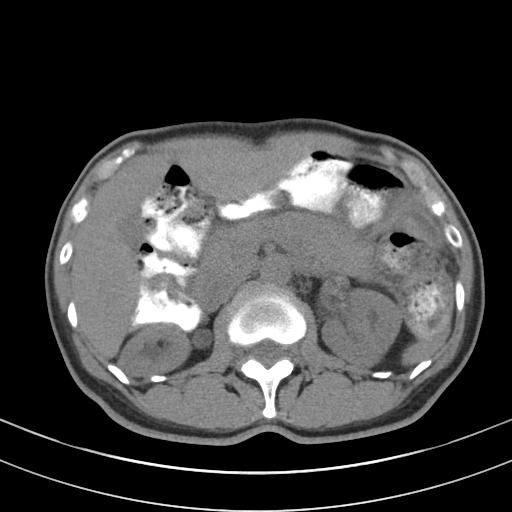
[im 49/82  lung]
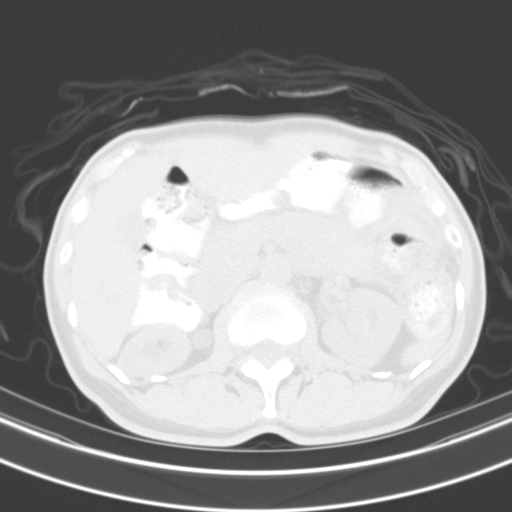
[im 65/82  soft-tissue]
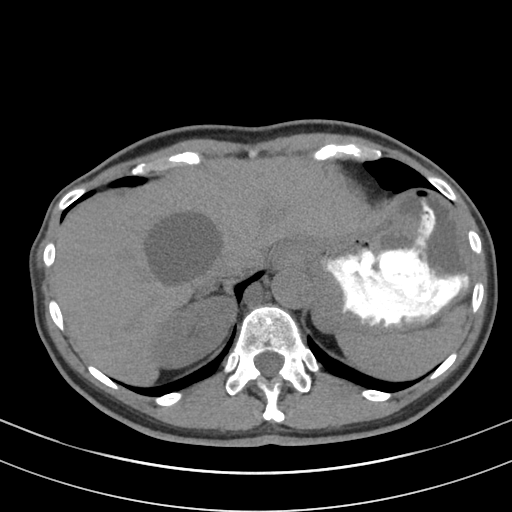
[im 65/82  lung]
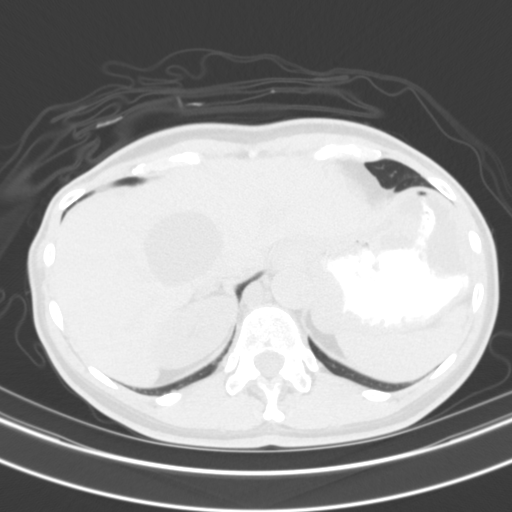

[Series 3: arterial · axial · arterial · 0.61mm/px · z∈[-233,-89]mm · 4 of 82 slices shown]
[im 17/82  soft-tissue]
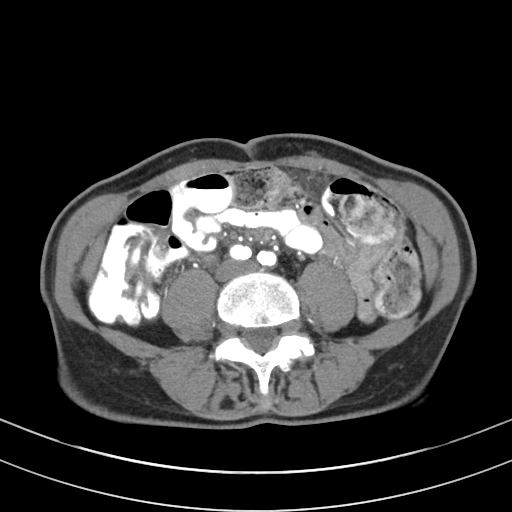
[im 33/82  soft-tissue]
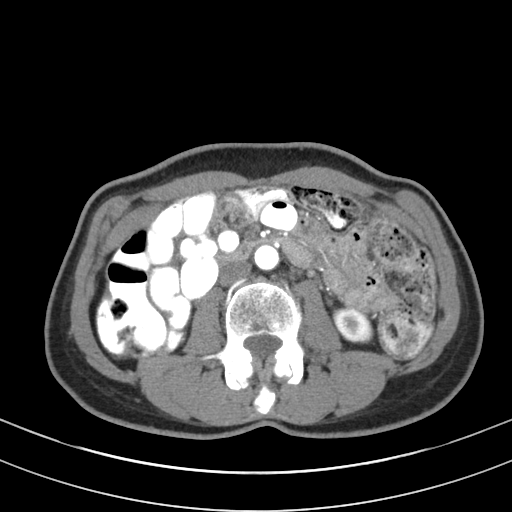
[im 49/82  soft-tissue]
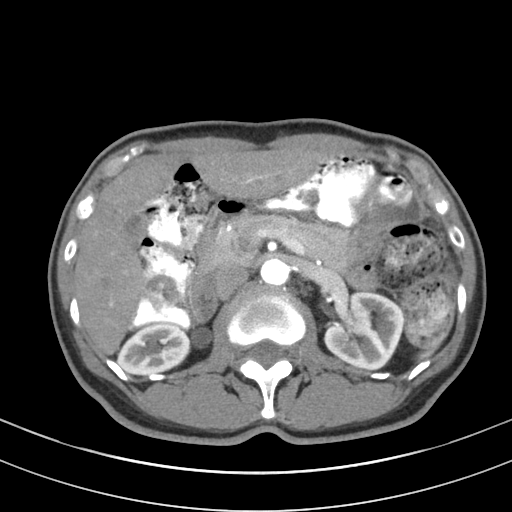
[im 65/82  soft-tissue]
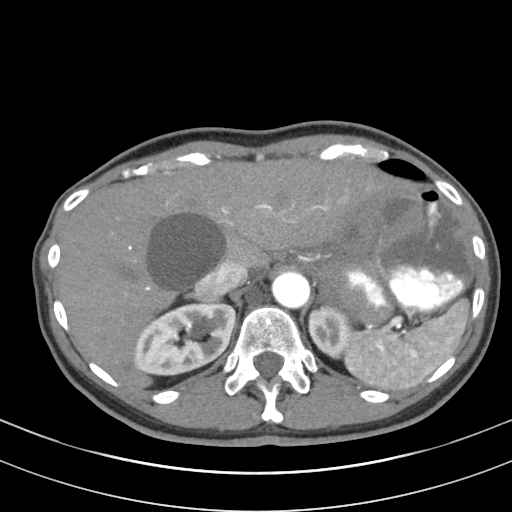

[Series 5: nephrographic · axial · 0.61mm/px · z∈[-233,-89]mm · 4 of 82 slices shown]
[im 17/82  soft-tissue]
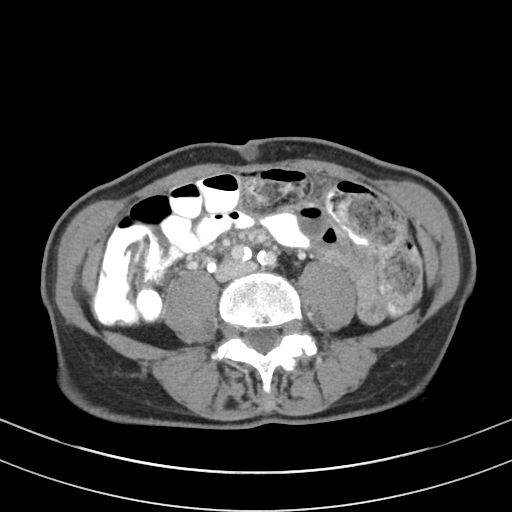
[im 33/82  soft-tissue]
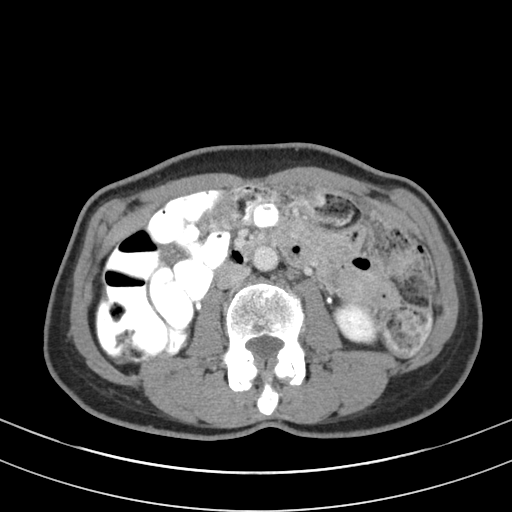
[im 49/82  soft-tissue]
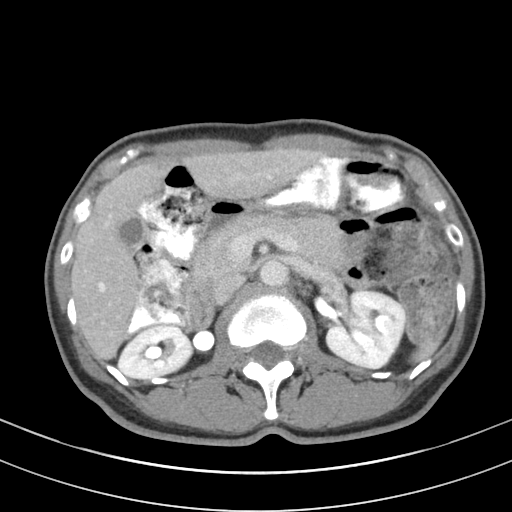
[im 65/82  soft-tissue]
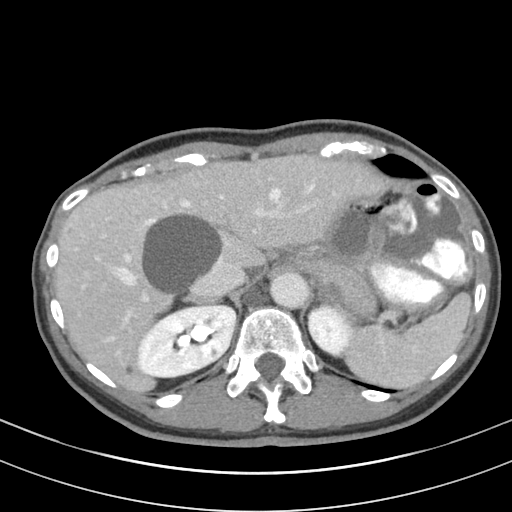

[12 of 32 positions shown; findings below may reference images not displayed]

FINDINGS: Lower chest: No acute abnormality.

Hepatobiliary: Dominant cyst within the central liver measures 5 cm,
image [DATE]. Additional small low-attenuation foci within the liver
are too small to reliably characterize. Gallbladder is unremarkable.
No biliary ductal dilatation.

Pancreas: Unremarkable. No pancreatic ductal dilatation or
surrounding inflammatory changes.

Spleen: Normal in size without focal abnormality.

Adrenals/Urinary Tract: Normal appearance of the adrenal glands.
Cyst within upper pole of the left kidney measures 2.9 cm, image
[DATE]. Within the posterior cortex of the right mid kidney there is a
9 mm cyst, image [DATE]. No kidney stones identified bilaterally. On
the delayed images there is symmetric opacification of the
collecting systems bilaterally. No suspicious filling defects
identified within the collecting systems or proximal ureters. Please
note, this exam includes imaging of the proximal renal collecting
systems and proximal ureters. Distal ureters and urinary bladder
remain unevaluated.

Stomach/Bowel: Stomach is within normal limits. Appendix appears
normal. No evidence of bowel wall thickening, distention, or
inflammatory changes.

Vascular/Lymphatic: Aortic atherosclerosis. No aneurysm. No
abdominal adenopathy.

Other: No abdominal wall hernia or abnormality.

Musculoskeletal: No acute or significant osseous findings.
IMPRESSION: 1. No acute findings within the abdomen. No kidney stone or focal
lesion identified within the renal collecting systems or proximal
ureters. Please be aware that the distal ureters and urinary bladder
were not imaged on this exam. In the setting of unexplained
hematuria following cystoscopy, or a cystoscopy is not planned,
dedicated imaging of the distal ureters and urinary bladder with
contrast material may be helpful.
2. Bilateral renal cysts.
3. Liver cysts.
4. Aortic atherosclerosis.

Aortic Atherosclerosis ([id number]-ZYM.M).

## 2023-01-26 ENCOUNTER — Other Ambulatory Visit: Payer: Self-pay | Admitting: Internal Medicine

## 2023-01-26 DIAGNOSIS — E782 Mixed hyperlipidemia: Secondary | ICD-10-CM

## 2023-02-09 ENCOUNTER — Ambulatory Visit
Admission: RE | Admit: 2023-02-09 | Discharge: 2023-02-09 | Disposition: A | Discharge status: Home / Self Care | Payer: No Typology Code available for payment source | Source: Ambulatory Visit | Attending: Internal Medicine | Admitting: Internal Medicine

## 2023-02-09 DIAGNOSIS — E782 Mixed hyperlipidemia: Secondary | ICD-10-CM

## 2024-03-13 ENCOUNTER — Encounter: Payer: Self-pay | Admitting: Neurology

## 2024-06-26 ENCOUNTER — Ambulatory Visit: Admitting: Neurology
